# Patient Record
Sex: Female | Born: 2000 | Race: White | Hispanic: No | Marital: Single | Smoking: Never smoker
Health system: Southern US, Community
[De-identification: ages and names within clinical notes are randomized; demographics above are authoritative.]

## PROBLEM LIST (undated history)

## (undated) DIAGNOSIS — F419 Anxiety disorder, unspecified: Secondary | ICD-10-CM

## (undated) DIAGNOSIS — Z789 Other specified health status: Secondary | ICD-10-CM

## (undated) HISTORY — DX: Anxiety disorder, unspecified: F41.9

## (undated) HISTORY — PX: NO PAST SURGERIES: SHX2092

## (undated) HISTORY — DX: Other specified health status: Z78.9

---
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
## 2021-11-22 DIAGNOSIS — Z635 Disruption of family by separation and divorce: Secondary | ICD-10-CM | POA: Diagnosis not present

## 2021-11-22 DIAGNOSIS — F4323 Adjustment disorder with mixed anxiety and depressed mood: Secondary | ICD-10-CM | POA: Diagnosis not present

## 2021-12-27 DIAGNOSIS — Z635 Disruption of family by separation and divorce: Secondary | ICD-10-CM | POA: Diagnosis not present

## 2021-12-27 DIAGNOSIS — F4323 Adjustment disorder with mixed anxiety and depressed mood: Secondary | ICD-10-CM | POA: Diagnosis not present

## 2022-01-17 DIAGNOSIS — F4323 Adjustment disorder with mixed anxiety and depressed mood: Secondary | ICD-10-CM | POA: Diagnosis not present

## 2022-01-17 DIAGNOSIS — Z635 Disruption of family by separation and divorce: Secondary | ICD-10-CM | POA: Diagnosis not present

## 2022-01-29 DIAGNOSIS — F4323 Adjustment disorder with mixed anxiety and depressed mood: Secondary | ICD-10-CM | POA: Diagnosis not present

## 2022-01-29 DIAGNOSIS — Z635 Disruption of family by separation and divorce: Secondary | ICD-10-CM | POA: Diagnosis not present

## 2022-02-08 DIAGNOSIS — Z635 Disruption of family by separation and divorce: Secondary | ICD-10-CM | POA: Diagnosis not present

## 2022-02-08 DIAGNOSIS — F4323 Adjustment disorder with mixed anxiety and depressed mood: Secondary | ICD-10-CM | POA: Diagnosis not present

## 2022-02-22 DIAGNOSIS — Z635 Disruption of family by separation and divorce: Secondary | ICD-10-CM | POA: Diagnosis not present

## 2022-02-22 DIAGNOSIS — F4323 Adjustment disorder with mixed anxiety and depressed mood: Secondary | ICD-10-CM | POA: Diagnosis not present

## 2022-03-07 DIAGNOSIS — F4323 Adjustment disorder with mixed anxiety and depressed mood: Secondary | ICD-10-CM | POA: Diagnosis not present

## 2022-03-07 DIAGNOSIS — Z635 Disruption of family by separation and divorce: Secondary | ICD-10-CM | POA: Diagnosis not present

## 2022-03-12 DIAGNOSIS — F329 Major depressive disorder, single episode, unspecified: Secondary | ICD-10-CM | POA: Diagnosis not present

## 2022-03-12 DIAGNOSIS — R45851 Suicidal ideations: Secondary | ICD-10-CM | POA: Diagnosis not present

## 2022-03-12 DIAGNOSIS — Z3041 Encounter for surveillance of contraceptive pills: Secondary | ICD-10-CM | POA: Diagnosis not present

## 2022-03-12 DIAGNOSIS — F411 Generalized anxiety disorder: Secondary | ICD-10-CM | POA: Diagnosis not present

## 2022-03-19 DIAGNOSIS — F4323 Adjustment disorder with mixed anxiety and depressed mood: Secondary | ICD-10-CM | POA: Diagnosis not present

## 2022-03-19 DIAGNOSIS — Z635 Disruption of family by separation and divorce: Secondary | ICD-10-CM | POA: Diagnosis not present

## 2022-04-04 DIAGNOSIS — Z635 Disruption of family by separation and divorce: Secondary | ICD-10-CM | POA: Diagnosis not present

## 2022-04-04 DIAGNOSIS — F4323 Adjustment disorder with mixed anxiety and depressed mood: Secondary | ICD-10-CM | POA: Diagnosis not present

## 2022-10-01 DIAGNOSIS — Z0000 Encounter for general adult medical examination without abnormal findings: Secondary | ICD-10-CM | POA: Diagnosis not present

## 2022-10-01 DIAGNOSIS — Z7189 Other specified counseling: Secondary | ICD-10-CM | POA: Diagnosis not present

## 2022-10-01 DIAGNOSIS — Z113 Encounter for screening for infections with a predominantly sexual mode of transmission: Secondary | ICD-10-CM | POA: Diagnosis not present

## 2022-10-01 DIAGNOSIS — Z713 Dietary counseling and surveillance: Secondary | ICD-10-CM | POA: Diagnosis not present

## 2022-10-01 DIAGNOSIS — Z23 Encounter for immunization: Secondary | ICD-10-CM | POA: Diagnosis not present

## 2022-10-01 DIAGNOSIS — Z6824 Body mass index (BMI) 24.0-24.9, adult: Secondary | ICD-10-CM | POA: Diagnosis not present

## 2023-03-06 DIAGNOSIS — F411 Generalized anxiety disorder: Secondary | ICD-10-CM | POA: Diagnosis not present

## 2023-03-06 DIAGNOSIS — F418 Other specified anxiety disorders: Secondary | ICD-10-CM | POA: Diagnosis not present

## 2023-09-30 ENCOUNTER — Ambulatory Visit: Admission: EM | Admit: 2023-09-30 | Discharge: 2023-09-30 | Disposition: A | Discharge status: Home / Self Care

## 2023-09-30 DIAGNOSIS — R4589 Other symptoms and signs involving emotional state: Secondary | ICD-10-CM | POA: Diagnosis not present

## 2023-09-30 NOTE — ED Triage Notes (Signed)
 Pt presents to UC for STD testing. Denies any active sx's.   Also c/o having increased anxiety d/t her thinking she has something. Concerned for HSV denies any lesions or rashes.

## 2023-09-30 NOTE — Discharge Instructions (Addendum)
-  Please follow up with PCP and OB/GYN for routine labs and STI screening -If you ever have mouth or vaginal lesions please return and let us  test you for HSV, otherwise do not worry about this.

## 2023-09-30 NOTE — ED Provider Notes (Signed)
 [facility]    CSN: [id number] Arrival date & time: 09/30/23  1148      History   Chief Complaint Chief Complaint  Patient presents with   Anxiety    HPI Barbara Kidd is a 23 y.o. female presenting for anxiety about health. She reports extreme fear of possibly having HSV-1 and not knowing. She has no mouth/lip lesions or genital lesions. She denies ever having any mouth/lip lesions. No vaginal discharge, rashes, dysuria. Has not been sexually active in 1-2 years. Last STI screening was 2 years go. She does not have a PCP currently. She would like to be tested for HSV-1, but no other STIs at this time. She says she feels like my life would be over if she tested positive for HSV-1. She  has looked up the stats that 50-80% of the population has HSV-1 and this is particularly scary to her.  HPI  History reviewed. No pertinent past medical history.  There are no active problems to display for this patient.   Past Surgical History:  Procedure Laterality Date   NO PAST SURGERIES      OB History   No obstetric history on file.      Home Medications    Prior to Admission medications   Not on File    Family History History reviewed. No pertinent family history.  Social History Social History   Tobacco Use   Smoking status: Never   Smokeless tobacco: Never  Vaping Use   Vaping status: Some Days  Substance Use Topics   Alcohol use: Yes   Drug use: Not Currently    Types: Marijuana     Allergies   Patient has no known allergies.   Review of Systems Review of Systems  HENT:  Negative for mouth sores and sore throat.   Gastrointestinal:  Negative for abdominal pain.  Genitourinary:  Negative for difficulty urinating, dysuria, frequency, pelvic pain, vaginal discharge and vaginal pain.  Skin:  Negative for rash.  Psychiatric/Behavioral:  The patient is nervous/anxious.      Physical Exam Triage Vital Signs ED Triage Vitals  Encounter  Vitals Group     BP 09/30/23 1205 122/76     Girls Systolic BP Percentile --      Girls Diastolic BP Percentile --      Boys Systolic BP Percentile --      Boys Diastolic BP Percentile --      Pulse Rate 09/30/23 1205 74     Resp 09/30/23 1205 16     Temp 09/30/23 1205 98.6 F (37 C)     Temp Source 09/30/23 1205 Oral     SpO2 09/30/23 1205 100 %     Weight 09/30/23 1205 139 lb (63 kg)     Height 09/30/23 1205 5' 3 (1.6 m)     Head Circumference --      Peak Flow --      Pain Score 09/30/23 1212 0     Pain Loc --      Pain Education --      Exclude from Growth Chart --    No data found.  Updated Vital Signs BP 122/76 (BP Location: Left Arm)   Pulse 74   Temp 98.6 F (37 C) (Oral)   Resp 16   Ht 5' 3 (1.6 m)   Wt 139 lb (63 kg)   LMP 09/16/2023 (Approximate)   SpO2 100%   BMI 24.62 kg/m    Physical Exam Vitals and  nursing note reviewed.  Constitutional:      General: She is not in acute distress.    Appearance: Normal appearance. She is not ill-appearing or toxic-appearing.  HENT:     Head: Normocephalic and atraumatic.     Nose: Nose normal.     Mouth/Throat:     Mouth: Mucous membranes are moist.     Pharynx: Oropharynx is clear.  Eyes:     General: No scleral icterus.       Right eye: No discharge.        Left eye: No discharge.     Conjunctiva/sclera: Conjunctivae normal.  Cardiovascular:     Rate and Rhythm: Normal rate and regular rhythm.     Heart sounds: Normal heart sounds.  Pulmonary:     Effort: Pulmonary effort is normal. No respiratory distress.     Breath sounds: Normal breath sounds.  Musculoskeletal:     Cervical back: Neck supple.  Skin:    General: Skin is dry.  Neurological:     General: No focal deficit present.     Mental Status: She is alert. Mental status is at baseline.     Motor: No weakness.     Gait: Gait normal.  Psychiatric:        Mood and Affect: Mood is anxious. Affect is tearful.      UC Treatments / Results   Labs (all labs ordered are listed, but only abnormal results are displayed) Labs Reviewed - No data to display  EKG   Radiology No results found.  Procedures Procedures (including critical care time)  Medications Ordered in UC Medications - No data to display  Initial Impression / Assessment and Plan / UC Course  I have reviewed the triage vital signs and the nursing notes.  Pertinent labs & imaging results that were available during my care of the patient were reviewed by me and considered in my medical decision making (see chart for details).   23 y/o female presents with her mother for anxiety about having potentially unknowingly having HSV-1. Denies any symptoms or exposures. No history of mouth or lip lesions. No vaginal discharge/odor, pelvic pain, dysuria. Has not been sexually active in 1-2 years.   Vitals are all stable and normal. On exam, patient does not have any intraoral or lip lesions. Chest clear and heart RRR. Patient is anxious and tearful throughout visit as she discusses her health anxiety.   We discussed that it is not advised to test for HSV-1 if people are asymptomatic. Explained the reasoning for this and agreed to proceed with other STI testing--swab for gonorrhea/chlamydia/trichomonas, syphilis and HIV. Patient reports not being concerned about any other STIs. She appears to understand why asymptomatic HSV testing is not advised. Nursing staff helped patient make a PCP appointment for 10/30/23. She also has upcoming OB/GYN appointment on 10/23/23. Patient will discuss her concerns further and have routine labs at that time. Reviewed returning here if she becomes symptomatic and we can test her for HSV.   Final Clinical Impressions(s) / UC Diagnoses   Final diagnoses:  Anxiety about health     Discharge Instructions      -Please follow up with PCP and OB/GYN for routine labs and STI screening -If you ever have mouth or vaginal lesions please return and  let us  test you for HSV, otherwise do not worry about this.     ED Prescriptions   None    PDMP not reviewed this encounter.   Arvis,  Jolan NOVAK, PA-C 09/30/23 1324

## 2023-10-23 ENCOUNTER — Ambulatory Visit (INDEPENDENT_AMBULATORY_CARE_PROVIDER_SITE_OTHER): Payer: Self-pay | Admitting: Obstetrics & Gynecology

## 2023-10-23 ENCOUNTER — Encounter: Payer: Self-pay | Admitting: Obstetrics & Gynecology

## 2023-10-23 ENCOUNTER — Other Ambulatory Visit (HOSPITAL_COMMUNITY)
Admission: RE | Admit: 2023-10-23 | Discharge: 2023-10-23 | Disposition: A | Discharge status: Home / Self Care | Source: Ambulatory Visit | Attending: Obstetrics & Gynecology | Admitting: Obstetrics & Gynecology

## 2023-10-23 VITALS — BP 102/68 | HR 75 | Ht 63.0 in | Wt 140.0 lb

## 2023-10-23 DIAGNOSIS — Z113 Encounter for screening for infections with a predominantly sexual mode of transmission: Secondary | ICD-10-CM | POA: Diagnosis not present

## 2023-10-23 DIAGNOSIS — Z01419 Encounter for gynecological examination (general) (routine) without abnormal findings: Secondary | ICD-10-CM | POA: Diagnosis not present

## 2023-10-23 DIAGNOSIS — Z124 Encounter for screening for malignant neoplasm of cervix: Secondary | ICD-10-CM | POA: Insufficient documentation

## 2023-10-23 NOTE — Progress Notes (Signed)
 GYNECOLOGY ANNUAL PHYSICAL EXAM PROGRESS NOTE  Subjective:    Barbara Kidd is a single 23 y.o. G0 who presents for an annual exam. She is new to this practice.  The patient is not currently sexually active. She has been abstinent for about 18 months.  The patient participates in regular exercise: yes (walking) Has the patient ever been transfused or tattooed?: no. The patient reports that there is not domestic violence in her life.   The patient has the following complaints today: She needs her first pap smear. She has never had STI testing. [place] at 7, minimal partners. She has issues with needles and declines blood STI testing today.  Menstrual History: Menarche age: 89 Patient's last menstrual period was 10/06/2023 (approximate). Period Cycle (Days): 28 Period Duration (Days): 3-4 Period Pattern: Regular Menstrual Flow: Light Dysmenorrhea: None   Gynecologic History:  Contraception: abstinence  FH- no breast, gyn, colon cancers      Upstream - 10/23/23 1317       Pregnancy Intention Screening   Does the patient want to become pregnant in the next year? No    Does the patient's partner want to become pregnant in the next year? No    Would the patient like to discuss contraceptive options today? No      Contraception Wrap Up   Current Method No Method - Other Reason    End Method No Method - Other Reason    Contraception Counseling Provided Yes            OB History  Gravida Para Term Preterm AB Living  0 0 0 0 0 0  SAB IAB Ectopic Multiple Live Births  0 0 0 0 0    Past Medical History:  Diagnosis Date   No pertinent past medical history     Past Surgical History:  Procedure Laterality Date   NO PAST SURGERIES      Family History  Problem Relation Age of Onset   Kidney Stones Mother    Diabetes Maternal Grandfather    Pancreatic cancer Paternal Grandmother        late years   Diabetes Paternal Grandmother    Prostate cancer  Paternal Grandfather    Esophageal cancer Paternal Grandfather     Social History   Socioeconomic History   Marital status: Single    Spouse name: Not on file   Number of children: Not on file   Years of education: Not on file   Highest education level: Not on file  Occupational History   Not on file  Tobacco Use   Smoking status: Never   Smokeless tobacco: Never  Vaping Use   Vaping status: Some Days  Substance and Sexual Activity   Alcohol use: Yes    Comment: occ   Drug use: Not Currently    Types: Marijuana   Sexual activity: Not Currently    Birth control/protection: None  Other Topics Concern   Not on file  Social History Narrative   Not on file   Social Drivers of Health   Financial Resource Strain: Not on file  Food Insecurity: Not on file  Transportation Needs: Not on file  Physical Activity: Not on file  Stress: Not on file  Social Connections: Not on file  Intimate Partner Violence: Not on file    No current outpatient medications on file prior to visit.   No current facility-administered medications on file prior to visit.    No Known Allergies  Review of Systems  Constitutional: negative for chills, fatigue, fevers and sweats Eyes: negative for irritation, redness and visual disturbance Ears, nose, mouth, throat, and face: negative for hearing loss, nasal congestion, snoring and tinnitus Respiratory: negative for asthma, cough, sputum Cardiovascular: negative for chest pain, dyspnea, exertional chest pressure/discomfort, irregular heart beat, palpitations and syncope Gastrointestinal: negative for abdominal pain, change in bowel habits, nausea and vomiting Genitourinary: negative for abnormal menstrual periods, genital lesions, sexual problems and vaginal discharge, dysuria and urinary incontinence Integument/breast: negative for breast lump, breast tenderness and nipple discharge Hematologic/lymphatic: negative for bleeding and easy  bruising Musculoskeletal:negative for back pain and muscle weakness Neurological: negative for dizziness, headaches, vertigo and weakness Endocrine: negative for diabetic symptoms including polydipsia, polyuria and skin dryness Allergic/Immunologic: negative for hay fever and urticaria     Her mom says that she had Gardasil series. 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 She used [employer] for several years when she was sexually active.    Objective:  Height 5' 3 (1.6 m), weight 140 lb (63.5 kg), last menstrual period 10/06/2023. Body mass index is 24.8 kg/m.    General Appearance:    Alert, cooperative, no distress, appears stated age  Head:    Normocephalic, without obvious abnormality, atraumatic  Eyes:    PERRL, conjunctiva/corneas clear, EOM's intact, both eyes  Ears:    Normal external ear canals, both ears  Nose:   Nares normal, septum midline, mucosa normal, no drainage or sinus tenderness  Throat:   Lips, mucosa, and tongue normal; teeth and gums normal  Neck:   Supple, symmetrical, trachea midline, no adenopathy; thyroid: no enlargement/tenderness/nodules; no carotid bruit or JVD  Back:     Symmetric, no curvature, ROM normal, no CVA tenderness  Lungs:     Clear to auscultation bilaterally, respirations unlabored  Chest Wall:    No tenderness or deformity   Heart:    Regular rate and rhythm, S1 and S2 normal, no murmur, rub or gallop  Breast Exam:    No tenderness, masses, or nipple abnormality  Abdomen:     Soft, non-tender, bowel sounds active all four quadrants, no masses, no organomegaly.    Genitalia:    Pelvic:external genitalia normal, vagina without lesions, discharge, or tenderness. Cervix normal in appearance, no cervical motion tenderness, no adnexal masses or tenderness.  Uterus normal size, shape, mobile, regular contours, nontender.     Extremities:   Extremities normal, atraumatic, no cyanosis or edema  Pulses:   2+ and  symmetric all extremities  Skin:   Skin color, texture, turgor normal, no rashes or lesions  Lymph nodes:   Cervical, supraclavicular, and axillary nodes normal  Neurologic:   CNII-XII intact, normal strength, sensation and reflexes throughout   .   Assessment:   Well woman  Cervical cancer screening STI screening- I will get swabs but she declines blood.   Plan:   Follow up in 1 year for annual exam   Barbara Harland BROCKS, MD [facility]

## 2023-10-25 LAB — CERVICOVAGINAL ANCILLARY ONLY
Bacterial Vaginitis (gardnerella): NEGATIVE
Candida Glabrata: POSITIVE — AB
Candida Vaginitis: POSITIVE — AB
Comment: NEGATIVE
Comment: NEGATIVE
Comment: NEGATIVE
Comment: NEGATIVE
Trichomonas: NEGATIVE

## 2023-10-25 LAB — CYTOLOGY - PAP
Chlamydia: NEGATIVE
Comment: NEGATIVE
Comment: NORMAL
Diagnosis: NEGATIVE
Neisseria Gonorrhea: NEGATIVE

## 2023-10-28 ENCOUNTER — Other Ambulatory Visit: Payer: Self-pay | Admitting: Obstetrics & Gynecology

## 2023-10-28 DIAGNOSIS — B379 Candidiasis, unspecified: Secondary | ICD-10-CM

## 2023-10-28 MED ORDER — FLUCONAZOLE 150 MG PO TABS: ORAL_TABLET | ORAL | 0 refills | Status: DC

## 2023-10-28 NOTE — Progress Notes (Signed)
 Glabrata to be treated with flagyl Message sent.

## 2023-10-30 ENCOUNTER — Ambulatory Visit: Admitting: Student

## 2023-10-30 ENCOUNTER — Encounter: Payer: Self-pay | Admitting: Student

## 2023-10-30 VITALS — BP 102/68 | HR 75 | Ht 63.0 in | Wt 141.0 lb

## 2023-10-30 DIAGNOSIS — Z0000 Encounter for general adult medical examination without abnormal findings: Secondary | ICD-10-CM

## 2023-10-30 DIAGNOSIS — B3731 Acute candidiasis of vulva and vagina: Secondary | ICD-10-CM | POA: Diagnosis not present

## 2023-10-30 DIAGNOSIS — F419 Anxiety disorder, unspecified: Secondary | ICD-10-CM | POA: Diagnosis not present

## 2023-10-30 MED ORDER — FLUCONAZOLE 150 MG PO TABS: ORAL_TABLET | ORAL | 0 refills | Status: AC

## 2023-10-30 NOTE — Assessment & Plan Note (Signed)
 On recent vaginal swab with candida albicans and candida glabrata. She is currently asymptomatic. Will order one dose of fluconazole  for this.

## 2023-10-30 NOTE — Progress Notes (Unsigned)
 New Patient Office Visit  Subjective    Patient ID: Zerina Hallinan, female    DOB: Jan 16, 2001  Age: 23 y.o. MRN: [id number]  CC:  Chief Complaint  Patient presents with   Establish Care    HPI Esbeidy Mclaine is a 23 year old person presents to establish care. She has not acute complaints.   History of anxiety since childhood. Junior and senior of high school started having panic attacks, racing thoughts, insomnia that was function limiting. Pediatrician prescribed her sertraline around 150 mg daily which she stopped about 1 year ago. Feels like she has more energy off the sertraline, felt very tired on sertraline. Overall mood feels better off the the sertraline, is not having frequent panic attacks about 1-2x in the last 6 months. Used to see therapist through [facility] and at university but not seeing anyone currently. Feels mood is more manageable lately denies SI, HI or AVD.   Was also on OCPs and which she discontinued about 1.5 years ago did not feel it was helping with menstrual symptoms. Outpatient Encounter Medications as of 10/30/2023  Medication Sig   fluconazole  (DIFLUCAN ) 150 MG tablet Take 1 pill per vagina and repeat this dose in 3 and 6 days   [DISCONTINUED] fluconazole  (DIFLUCAN ) 150 MG tablet Take 1 pill per vagina and repeat this dose in 3 and 6 days   No facility-administered encounter medications on file as of 10/30/2023.    Past Medical History:  Diagnosis Date   Anxiety    No pertinent past medical history     Past Surgical History:  Procedure Laterality Date   NO PAST SURGERIES      Family History  Problem Relation Age of Onset   Kidney Stones Mother    Diabetes Maternal Grandfather    Pancreatic cancer Paternal Grandmother        late years   Diabetes Paternal Grandmother    Stroke Paternal Grandfather    Prostate cancer Paternal Grandfather    Esophageal cancer Paternal Grandfather     Social History   Socioeconomic History   Marital  status: Single    Spouse name: Not on file   Number of children: 0   Years of education: Not on file   Highest education level: Not on file  Occupational History   Not on file  Tobacco Use   Smoking status: Never   Smokeless tobacco: Never  Vaping Use   Vaping status: Some Days   Substances: Nicotine, CBD, Flavoring  Substance and Sexual Activity   Alcohol use: Yes    Comment: occ   Drug use: Not Currently    Types: Marijuana   Sexual activity: Not Currently    Birth control/protection: None  Other Topics Concern   Not on file  Social History Narrative   Not on file   Social Drivers of Health   Financial Resource Strain: Not on file  Food Insecurity: No Food Insecurity (10/30/2023)   Hunger Vital Sign    Worried About Running Out of Food in the Last Year: Never true    Ran Out of Food in the Last Year: Never true  Transportation Needs: No Transportation Needs (10/30/2023)   PRAPARE - Administrator, Civil Service (Medical): No    Lack of Transportation (Non-Medical): No  Physical Activity: Not on file  Stress: Not on file  Social Connections: Not on file  Intimate Partner Violence: Not At Risk (10/30/2023)   Humiliation, Afraid, Rape, and Kick questionnaire  Fear of Current or Ex-Partner: No    Emotionally Abused: No    Physically Abused: No    Sexually Abused: No    ROS Refer to HPI    Objective   BP 102/68   Pulse 75   Ht 5' 3 (1.6 m)   Wt 141 lb (64 kg)   LMP 10/06/2023 (Approximate)   SpO2 99%   BMI 24.98 kg/m   Physical Exam Constitutional:      Appearance: Normal appearance.  HENT:     Head: Normocephalic and atraumatic.     Mouth/Throat:     Mouth: Mucous membranes are moist.     Pharynx: Oropharynx is clear.  Neck:     Comments: No masses Cardiovascular:     Rate and Rhythm: Normal rate and regular rhythm.  Pulmonary:     Effort: Pulmonary effort is normal.     Breath sounds: No rhonchi or rales.  Abdominal:     General:  Abdomen is flat. Bowel sounds are normal. There is no distension.     Palpations: Abdomen is soft.     Tenderness: There is no abdominal tenderness.  Musculoskeletal:        General: Normal range of motion.     Cervical back: No tenderness.     Right lower leg: No edema.     Left lower leg: No edema.  Skin:    General: Skin is warm and dry.     Capillary Refill: Capillary refill takes less than 2 seconds.  Neurological:     General: No focal deficit present.     Mental Status: She is alert and oriented to person, place, and time.  Psychiatric:        Mood and Affect: Mood normal.        Behavior: Behavior normal.        Thought Content: Thought content normal.        10/30/2023    3:20 PM  Depression screen PHQ 2/9  Decreased Interest 1  Down, Depressed, Hopeless 1  PHQ - 2 Score 2  Altered sleeping 0  Tired, decreased energy 1  Change in appetite 0  Feeling bad or failure about yourself  0  Trouble concentrating 0  Moving slowly or fidgety/restless 0  Suicidal thoughts 0  PHQ-9 Score 3  Difficult doing work/chores Somewhat difficult      10/30/2023    3:20 PM  GAD 7 : Generalized Anxiety Score  Nervous, Anxious, on Edge 1  Control/stop worrying 1  Worry too much - different things 1  Trouble relaxing 1  Restless 0  Easily annoyed or irritable 0  Afraid - awful might happen 1  Total GAD 7 Score 5  Anxiety Difficulty Not difficult at all    Assessment & Plan:  Annual physical exam Assessment & Plan: Labwork today Continue diet and exercise.  PAP cytology on 10/23/2023 with [facility]YN [facility] next due in 3 years.  Declined flu vaccination today  Orders: -     CBC with Differential/Platelet -     TSH -     Lipid panel -     Comprehensive metabolic panel with GFR -     Hemoglobin A1c -     HIV Antibody (routine testing w rflx) -     Hepatitis C antibody  Anxiety Assessment & Plan: PHQ9 of 3 and GAD of 5, history of taking zoloft but has not been on this for a  year. Report this causes significant fatigue. Feels  anxiety is manageable. Does have occasional panic attacks although feels this is rare. Discussed resuming counseling, she will look into psychology today. Discussed as needed hydroxyzine for panic attacks which she will think about.    Vulvovaginal candidiasis Assessment & Plan: On recent vaginal swab with candida albicans and candida glabrata. She is currently asymptomatic. Will order one dose of fluconazole  for this.    Other orders -     Fluconazole ; Take 1 pill per vagina and repeat this dose in 3 and 6 days  Dispense: 1 tablet; Refill: 0    Return in about 1 year (around 10/29/2024) for physical.   Harlene Saddler, MD

## 2023-11-01 DIAGNOSIS — Z0000 Encounter for general adult medical examination without abnormal findings: Secondary | ICD-10-CM | POA: Insufficient documentation

## 2023-11-01 NOTE — Assessment & Plan Note (Signed)
 Labwork today Continue diet and exercise.  PAP cytology on 10/23/2023 with GYN [facility] next due in 3 years.  Declined flu vaccination today

## 2023-11-01 NOTE — Assessment & Plan Note (Signed)
 PHQ9 of 3 and GAD of 5, history of taking zoloft but has not been on this for a year. Report this causes significant fatigue. Feels anxiety is manageable. Does have occasional panic attacks although feels this is rare. Discussed resuming counseling, she will look into psychology today. Discussed as needed hydroxyzine for panic attacks which she will think about.

## 2024-11-02 ENCOUNTER — Encounter: Admitting: Student
